# Patient Record
Sex: Male | Born: 2005 | Race: White | Hispanic: No | Marital: Single | Smoking: Never smoker
Health system: Southern US, Community
[De-identification: ages and names within clinical notes are randomized; demographics above are authoritative.]

---
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
## 2024-02-02 ENCOUNTER — Ambulatory Visit (INDEPENDENT_AMBULATORY_CARE_PROVIDER_SITE_OTHER)

## 2024-02-02 ENCOUNTER — Ambulatory Visit: Admitting: Sports Medicine

## 2024-02-02 VITALS — BP 130/80 | HR 67 | Ht 71.0 in | Wt 187.0 lb

## 2024-02-02 DIAGNOSIS — M79672 Pain in left foot: Secondary | ICD-10-CM

## 2024-02-02 DIAGNOSIS — S86812A Strain of other muscle(s) and tendon(s) at lower leg level, left leg, initial encounter: Secondary | ICD-10-CM | POA: Diagnosis not present

## 2024-02-02 MED ORDER — MELOXICAM 15 MG PO TABS: ORAL_TABLET | ORAL | 0 refills | Status: AC

## 2024-02-02 NOTE — Patient Instructions (Signed)
-   Start meloxicam  15 mg daily x2 weeks.  If still having pain after 2 weeks, complete 3rd-week of NSAID. May use remaining NSAID as needed once daily for pain control.  Do not to use additional over-the-counter NSAIDs (ibuprofen , naproxen, Advil , Aleve, etc.) while taking prescription NSAIDs.  May use Tylenol  [phone] mg 2 to 3 times a day for breakthrough pain.  Ankle HEP   At least 1 week of no running  -Recommend gradual return to physical activity.  Start activity at 50% (speed, duration, reps, sets, intensity) and allow 24 hours to assess for worsening pain.  If 50% is well-tolerated, may increase next activity to 75%.  If 75% is well-tolerated, may increase next physical activity to 100%.  If any of these levels cause pain, recommend dropping down to previous level for an additional 2-3 attempts before advancing.  4 week follow up

## 2024-02-02 NOTE — Progress Notes (Signed)
 Ronnie Lester D.Ron[facility] L[facility] [address] [place] [address] Phone: [phone]   Assessment and Plan:     1. Left foot pain (Primary) 2. Strain of left tibialis anterior muscle, initial encounter -Subacute, initial visit - Most consistent with strain of left anterior tibialis tendon at distal insertion likely from increased running activities - Start meloxicam  15 mg daily x2 weeks.  If still having pain after 2 weeks, complete 3rd-week of NSAID. May use remaining NSAID as needed once daily for pain control.  Do not to use additional over-the-counter NSAIDs (ibuprofen , naproxen, Advil , Aleve, etc.) while taking prescription NSAIDs.  May use Tylenol  [phone] mg 2 to 3 times a day for breakthrough pain. - Start HEP for ankle -Recommend gradual return to physical activity after 1 week of rest.  Start activity at 50% (speed, duration, reps, sets, intensity) and allow 24 hours to assess for worsening pain.  If 50% is well-tolerated, may increase next activity to 75%.  If 75% is well-tolerated, may increase next physical activity to 100%.  If any of these levels cause pain, recommend dropping down to previous level for an additional 2-3 attempts before advancing. -X-ray obtained in clinic.  My interpretation: No acute fracture or dislocation  15 additional minutes spent for educating Therapeutic Home Exercise Program.  This included exercises focusing on stretching, strengthening, with focus on eccentric aspects.   Long term goals include an improvement in range of motion, strength, endurance as well as avoiding reinjury. Patient's frequency would include in 1-2 times a day, 3-5 times a week for a duration of 6-12 weeks. Proper technique shown and discussed handout in great detail with ATC.  All questions were discussed and answered.    Pertinent previous records reviewed include none   Follow Up: 4 weeks for reevaluation.  If improving, could provide full  clearance.  If still symptomatic, could consider ultrasound versus physical therapy versus prolonged rest versus prednisone course   Subjective:   I, Ronnie Lester, am serving as a neurosurgeon for Ronnie Lester  Chief Complaint: left foot pain   HPI:   02/02/24 Patient is a 18 year old male with left foot pain. Patient states he has been running and walking a lot. Pain on the top of his foot. Decreased ROM. Antalgic gait. 7 weeks of pain. Has been using an insert and that has helped. No meds for the pain. Is not able to run like he used to run. No radiating pain. No numbness or tingling.    Relevant Historical Information: None pertinent  Additional pertinent review of systems negative.   Current Outpatient Medications:    meloxicam  (MOBIC ) 15 MG tablet, Take 1 tablet daily for 2 weeks.  If still in pain after 2 weeks, take 1 tablet daily for an additional 1 week., Disp: 30 tablet, Rfl: 0   Cetirizine HCl (ZYRTEC PO), Take 1 tablet by mouth daily., Disp: , Rfl:    fluticasone (FLONASE) 50 MCG/ACT nasal spray, Place 1 spray into both nostrils daily., Disp: , Rfl:    meloxicam  (MOBIC ) 15 MG tablet, Take 1 tablet (15 mg total) by mouth daily., Disp: 30 tablet, Rfl: 0   montelukast (SINGULAIR) 5 MG chewable tablet, Chew 1 tablet by mouth every morning., Disp: , Rfl:    Pediatric Multiple Vit-C-FA (PEDIATRIC MULTIVITAMIN) chewable tablet, Chew 1 tablet by mouth daily., Disp: , Rfl:    Objective:     Vitals:   02/02/24 0851  BP: 130/80  Pulse: 67  SpO2: 99%  Weight: 187 lb (84.8 kg)  Height: 5' 11 (1.803 m)      Body mass index is 26.08 kg/m.    Physical Exam:    Gen: Appears well, nad, nontoxic and pleasant Psych: Alert and oriented, appropriate mood and affect Neuro: sensation intact, strength is 5/5 with df/pf/inv/ev, muscle tone wnl Skin: no susupicious lesions or rashes  Left foot/ankle:  No deformity, no swelling or effusion TTP dorsal surface of first  metatarsal NTTP over plantar, medial surface of first metatarsal, fibular head, lat mal, medial mal, achilles, navicular, base of 5th, ATFL, CFL, deltoid, calcaneous or midfoot ROM DF 30, PF 45, inv/ev intact Negative ant drawer, talar tilt, rotation test, squeeze test. Neg thompson No pain with resisted inversion or eversion  Dorsal foot pain with single and double leg heel raise  Electronically signed by:  Ronnie Lester D.Ronnie [facility] 9:07 AM 02/02/24

## 2024-02-07 ENCOUNTER — Ambulatory Visit: Payer: Self-pay | Admitting: Sports Medicine
